# Patient Record
Sex: Female | Born: 2000 | Race: Black or African American | Hispanic: No | Marital: Single | Smoking: Never smoker
Health system: Southern US, Community
[De-identification: ages and names within clinical notes are randomized; demographics above are authoritative.]

---
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
## 2019-06-13 DIAGNOSIS — U071 COVID-19: Secondary | ICD-10-CM | POA: Insufficient documentation

## 2019-07-10 ENCOUNTER — Other Ambulatory Visit: Payer: Self-pay

## 2019-07-10 ENCOUNTER — Emergency Department
Admission: EM | Admit: 2019-07-10 | Discharge: 2019-07-10 | Disposition: A | Discharge status: Home / Self Care | Payer: No Typology Code available for payment source | Attending: Emergency Medicine | Admitting: Emergency Medicine

## 2019-07-10 DIAGNOSIS — K529 Noninfective gastroenteritis and colitis, unspecified: Secondary | ICD-10-CM | POA: Insufficient documentation

## 2019-07-10 DIAGNOSIS — R197 Diarrhea, unspecified: Secondary | ICD-10-CM | POA: Insufficient documentation

## 2019-07-10 LAB — COMPREHENSIVE METABOLIC PANEL
ALT: 20 U/L (ref 0–44)
AST: 13 U/L — ABNORMAL LOW (ref 15–41)
Albumin: 3.7 g/dL (ref 3.5–5.0)
Alkaline Phosphatase: 78 U/L (ref 38–126)
Anion gap: 8 (ref 5–15)
BUN: 13 mg/dL (ref 6–20)
CO2: 22 mmol/L (ref 22–32)
Calcium: 8.7 mg/dL — ABNORMAL LOW (ref 8.9–10.3)
Chloride: 107 mmol/L (ref 98–111)
Creatinine, Ser: 0.64 mg/dL (ref 0.44–1.00)
GFR calc Af Amer: >60 mL/min (ref >60)
GFR calc non Af Amer: >60 mL/min (ref >60)
Glucose, Bld: 98 mg/dL (ref 70–99)
Potassium: 3.4 mmol/L — ABNORMAL LOW (ref 3.5–5.1)
Sodium: 137 mmol/L (ref 135–145)
Total Bilirubin: 0.6 mg/dL (ref 0.3–1.2)
Total Protein: 7.9 g/dL (ref 6.5–8.1)

## 2019-07-10 LAB — LIPASE, BLOOD: Lipase: 21 U/L (ref 11–51)

## 2019-07-10 LAB — CBC
HCT: 34.6 % — ABNORMAL LOW (ref 36.0–46.0)
Hemoglobin: 10.6 g/dL — ABNORMAL LOW (ref 12.0–15.0)
MCH: 22.6 pg — ABNORMAL LOW (ref 26.0–34.0)
MCHC: 30.6 g/dL (ref 30.0–36.0)
MCV: 73.6 fL — ABNORMAL LOW (ref 80.0–100.0)
Platelets: 355 10*3/uL (ref 150–400)
RBC: 4.7 MIL/uL (ref 3.87–5.11)
RDW: 21.3 % — ABNORMAL HIGH (ref 11.5–15.5)
WBC: 6.2 10*3/uL (ref 4.0–10.5)
nRBC: 0 % (ref 0.0–0.2)

## 2019-07-10 MED ORDER — ONDANSETRON 4 MG PO TBDP
4.0000 mg | ORAL_TABLET | Freq: Once | ORAL | Status: AC
  Administered 2019-07-10: 4 mg via ORAL
  Filled 2019-07-10: qty 1

## 2019-07-10 MED ORDER — ONDANSETRON 4 MG PO TBDP: 4.0000 mg | ORAL_TABLET | Freq: Three times a day (TID) | ORAL | 0 refills | Status: AC | PRN

## 2019-07-10 NOTE — ED Provider Notes (Signed)
[facility] Emergency Department Provider Note   ____________________________________________    I have reviewed the triage vital signs and the nursing notes.   HISTORY  Chief Complaint Diarrhea, Nausea, and Headache     HPI Diamond Bishop is a 19 y.o. female who presents with complaints of nausea vomiting abdominal cramping and diarrhea, she notes the symptoms been ongoing for several days.  She denies known sick contacts.  No fevers or chills.  No loss of taste or smell.  Has not take anything for this.  Nonbilious nonbloody vomitus.  Watery stools.  No recent travel.  No recent camping  No past medical history on file.  There are no problems to display for this patient.   No past surgical history on file.  Prior to Admission medications   Medication Sig Start Date End Date Taking? Authorizing Provider  ondansetron (ZOFRAN ODT) 4 MG disintegrating tablet Take 1 tablet (4 mg total) by mouth every 8 (eight) hours as needed. 07/10/19   Lavonia Drafts, MD  prochlorperazine (COMPAZINE) 10 MG tablet Take 1 tablet (10 mg total) by mouth every 6 (six) hours as needed for nausea or vomiting. 08/03/17   Merlyn Lot, MD     Allergies Patient has no known allergies.  No family history on file.  Social History Social History   Tobacco Use  . Smoking status: Never Smoker  . Smokeless tobacco: Never Used  Substance Use Topics  . Alcohol use: Not on file  . Drug use: Not on file    Review of Systems  Constitutional: No fever/chills Eyes: No visual changes.  ENT: No sore throat. Cardiovascular: Denies chest pain. Respiratory: Denies cough Gastrointestinal: As above Genitourinary: Negative for dysuria. Musculoskeletal: Negative for joint swelling Skin: Negative for rash. Neurological: Occasional headache   ____________________________________________   PHYSICAL EXAM:  VITAL SIGNS: ED Triage Vitals  Enc Vitals Group     BP  07/10/19 1003 128/78     Pulse Rate 07/10/19 1003 (!) 101     Resp 07/10/19 1003 18     Temp 07/10/19 1003 98.9 F (37.2 C)     Temp Source 07/10/19 1003 Oral     SpO2 07/10/19 1003 100 %     Weight 07/10/19 1003 127 kg (280 lb)     Height 07/10/19 1003 1.753 m (5\' 9" )     Head Circumference --      Peak Flow --      Pain Score 07/10/19 1017 5     Pain Loc --      Pain Edu? --      Excl. in [place]? --     Constitutional: Alert and oriented. No acute distress. Pleasant and interactive  Nose: No congestion/rhinnorhea. Mouth/Throat: Mucous membranes are moist.    Cardiovascular: Normal rate, regular rhythm. Kermit Balo peripheral circulation. Respiratory: Normal respiratory effort.  No retractions. Lungs CTAB. Gastrointestinal: Soft and nontender. No distention.  No CVA tenderness.  Reassuring exam  Musculoskeletal: .  Warm and well perfused Neurologic:  Normal speech and language. No gross focal neurologic deficits are appreciated.  Skin:  Skin is warm, dry and intact. No rash noted. Psychiatric: Mood and affect are normal. Speech and behavior are normal.  ____________________________________________   LABS (all labs ordered are listed, but only abnormal results are displayed)  Labs Reviewed  COMPREHENSIVE METABOLIC PANEL - Abnormal; Notable for the following components:      Result Value   Potassium 3.4 (*)  Calcium 8.7 (*)    AST 13 (*)    All other components within normal limits  CBC - Abnormal; Notable for the following components:   Hemoglobin 10.6 (*)    HCT 34.6 (*)    MCV 73.6 (*)    MCH 22.6 (*)    RDW 21.3 (*)    All other components within normal limits  LIPASE, BLOOD  URINALYSIS, COMPLETE (UACMP) WITH MICROSCOPIC  POC URINE PREG, ED   ____________________________________________  EKG   ____________________________________________  RADIOLOGY   ____________________________________________   PROCEDURES  Procedure(s) performed:  No  Procedures   Critical Care performed: No ____________________________________________   INITIAL IMPRESSION / ASSESSMENT AND PLAN / ED COURSE  Pertinent labs & imaging results that were available during my care of the patient were reviewed by me and considered in my medical decision making (see chart for details).  Patient presents with nausea vomiting abdominal cramping and diarrhea suspicious for viral gastroenteritis which is prevalent in the community at this time.  Abdominal exam is quite reassuring, no significant tenderness, no distention.  Patient appears well-hydrated with moist mucous membranes.  Lab work is overall unremarkable, white blood cell count is normal.  BUN/creatinine ratio is normal.  LFTs are unremarkable.  We will treat with ODT Zofran, prescription provided for the same.  Recommend supportive care, outpatient follow-up, return precautions discussed    ____________________________________________   FINAL CLINICAL IMPRESSION(S) / ED DIAGNOSES  Final diagnoses:  Gastroenteritis        Note:  This document was prepared using Dragon voice recognition software and may include unintentional dictation errors.   Jene Every, MD 07/10/19 1534

## 2019-07-10 NOTE — ED Triage Notes (Signed)
Pt arrived via POV with reports of HA, Nausea and Diarrhea since Thursday, reports multiple episodes of diarrhea per day since Thursday.

## 2019-07-10 NOTE — ED Notes (Signed)
EDP at bedside at this time.  

## 2019-07-10 NOTE — ED Notes (Signed)
NAD noted at time of D/C. Pt denies questions or concerns. Pt ambulatory to the lobby at this time.  

## 2019-09-19 IMAGING — US US ABDOMEN LIMITED
1 series · 12 of 12 positions shown · non-contrast
Comparison: None.

CLINICAL DATA: Mononucleosis.  Left upper quadrant abdominal pain.

EXAM:
ULTRASOUND ABDOMEN LIMITED

[Series 1: us abdomen limited · 0.26mm/px · 12 of 12 slices shown]
[im 1/12]
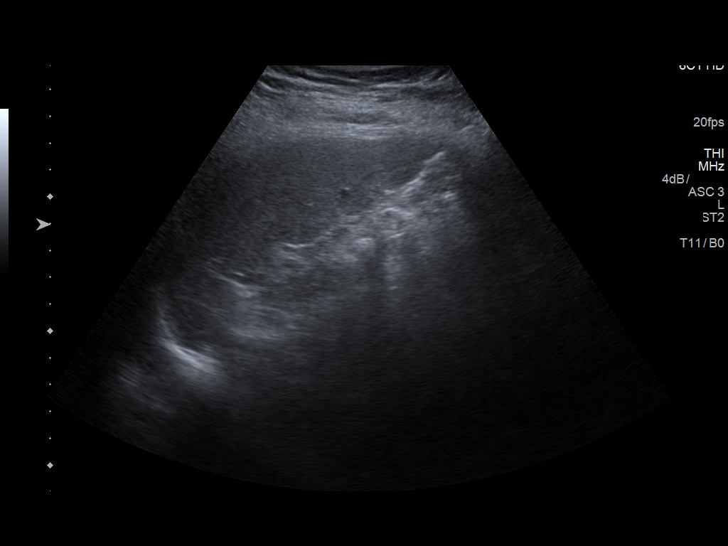
[im 2/12]
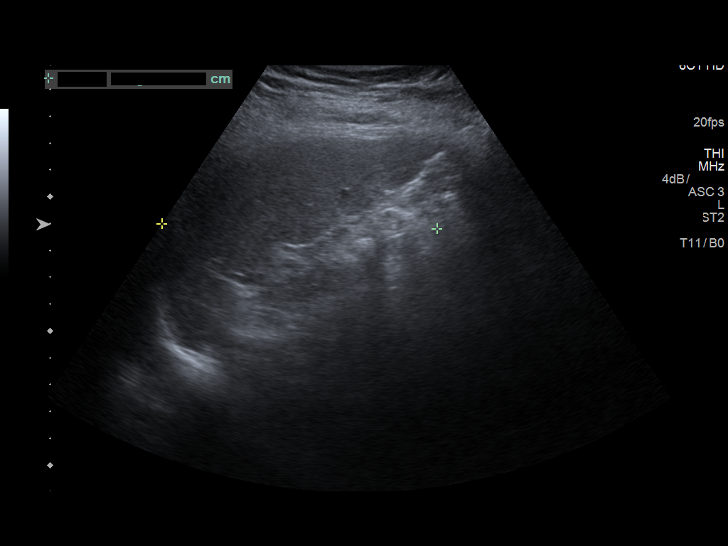
[im 3/12]
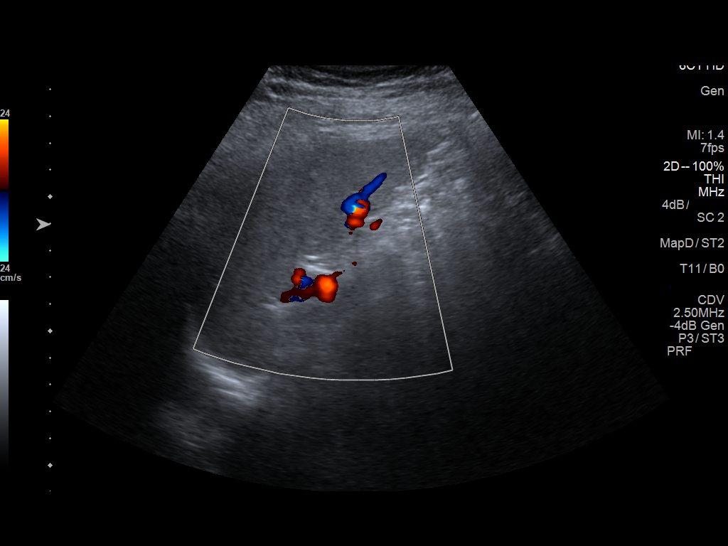
[im 4/12]
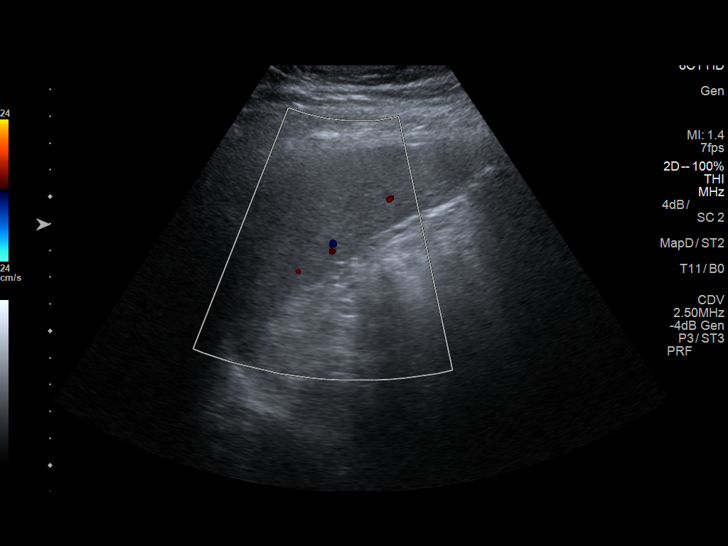
[im 5/12]
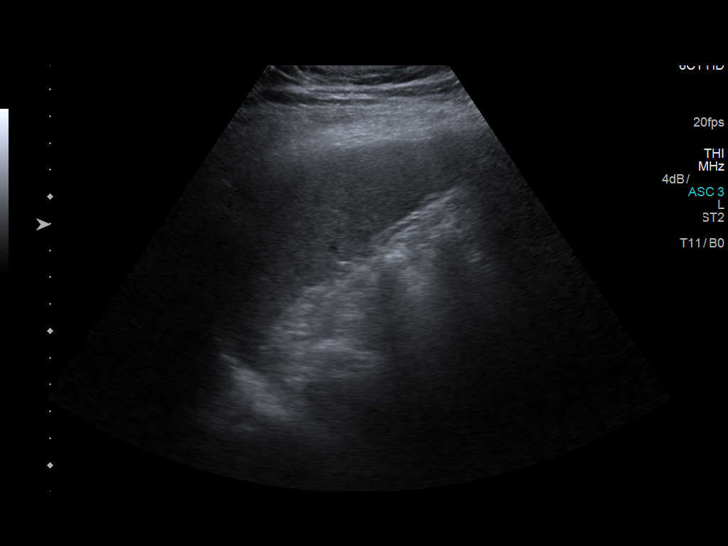
[im 6/12]
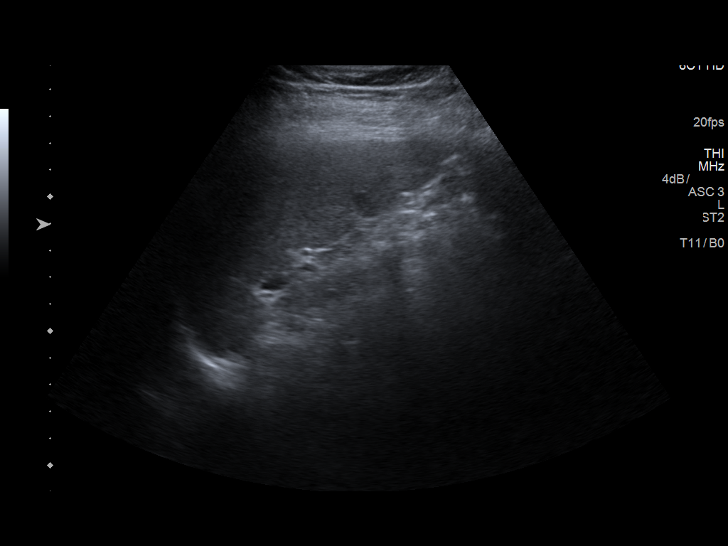
[im 7/12]
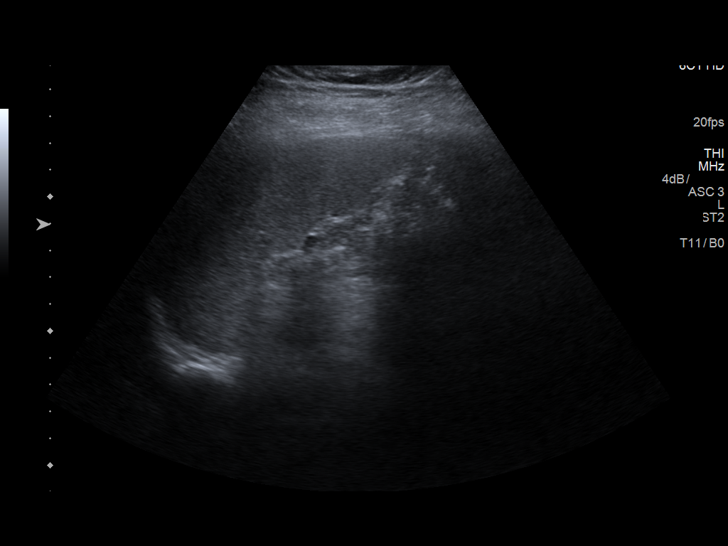
[im 8/12]
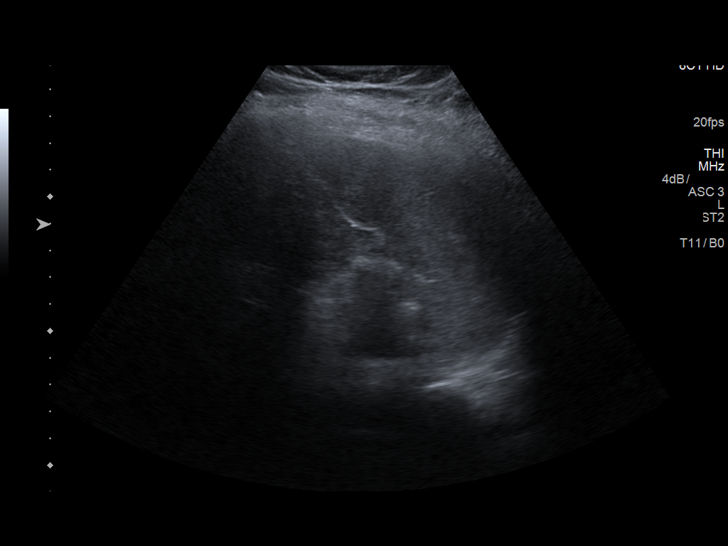
[im 9/12]
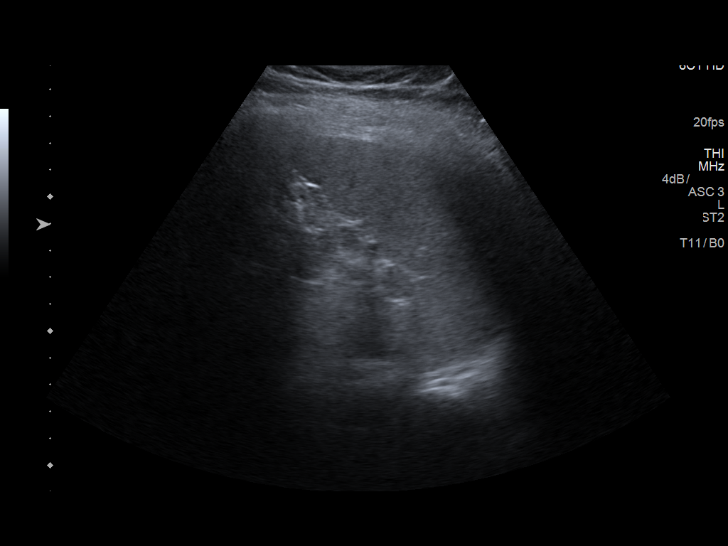
[im 10/12]
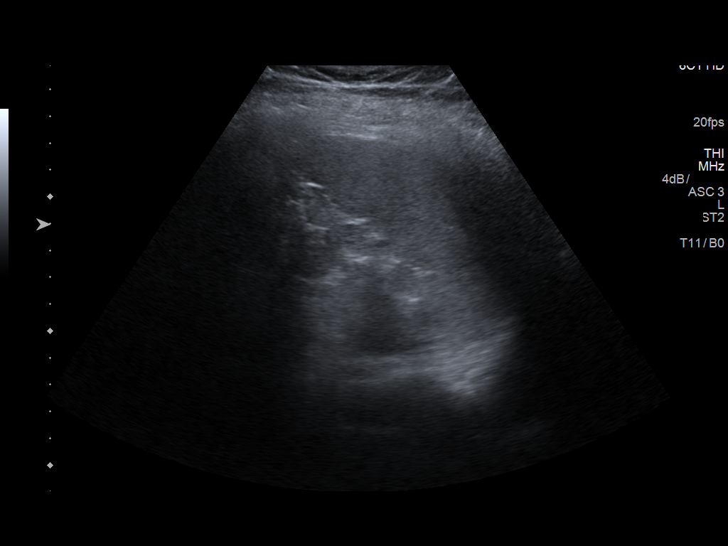
[im 11/12]
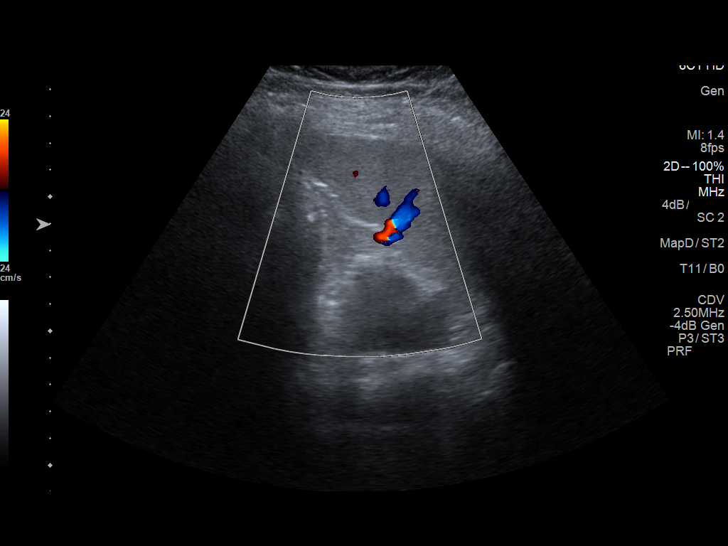
[im 12/12]
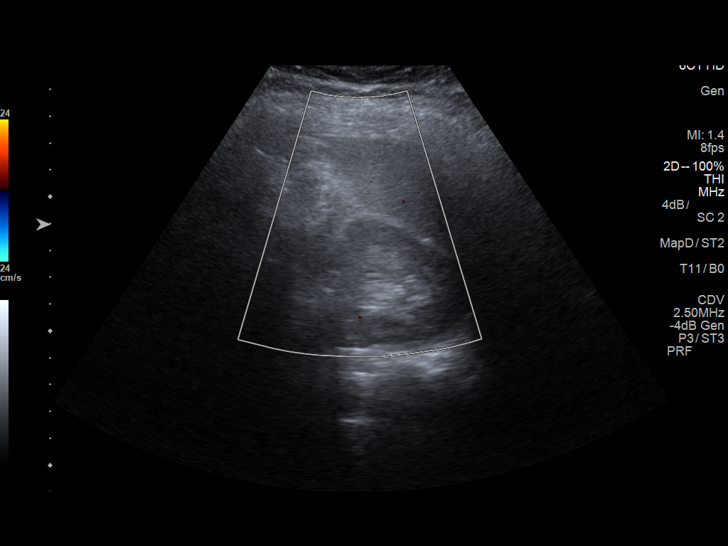

[12 of 12 positions shown; findings below may reference images not displayed]

FINDINGS: Craniocaudal splenic length 10.3 cm, normal. No splenic mass. No
perisplenic fluid collection.
IMPRESSION: Normal size spleen.  No splenic mass or perisplenic collection.

## 2022-04-11 ENCOUNTER — Emergency Department: Payer: Medicaid Other

## 2022-04-11 ENCOUNTER — Other Ambulatory Visit: Payer: Self-pay

## 2022-04-11 ENCOUNTER — Emergency Department
Admission: EM | Admit: 2022-04-11 | Discharge: 2022-04-11 | Discharge status: Against Medical Advice | Payer: Medicaid Other | Attending: Emergency Medicine | Admitting: Emergency Medicine

## 2022-04-11 DIAGNOSIS — R42 Dizziness and giddiness: Secondary | ICD-10-CM | POA: Insufficient documentation

## 2022-04-11 DIAGNOSIS — R519 Headache, unspecified: Secondary | ICD-10-CM | POA: Insufficient documentation

## 2022-04-11 DIAGNOSIS — R0602 Shortness of breath: Secondary | ICD-10-CM | POA: Insufficient documentation

## 2022-04-11 DIAGNOSIS — Z5321 Procedure and treatment not carried out due to patient leaving prior to being seen by health care provider: Secondary | ICD-10-CM | POA: Insufficient documentation

## 2022-04-11 DIAGNOSIS — R079 Chest pain, unspecified: Secondary | ICD-10-CM | POA: Insufficient documentation

## 2022-04-11 LAB — BASIC METABOLIC PANEL
Anion gap: 11 (ref 5–15)
BUN: 15 mg/dL (ref 6–20)
CO2: 22 mmol/L (ref 22–32)
Calcium: 8.5 mg/dL — ABNORMAL LOW (ref 8.9–10.3)
Chloride: 105 mmol/L (ref 98–111)
Creatinine, Ser: 0.74 mg/dL (ref 0.44–1.00)
GFR, Estimated: >60 mL/min (ref >60)
Glucose, Bld: 115 mg/dL — ABNORMAL HIGH (ref 70–99)
Potassium: 3.5 mmol/L (ref 3.5–5.1)
Sodium: 138 mmol/L (ref 135–145)

## 2022-04-11 LAB — CBC
HCT: 34.6 % — ABNORMAL LOW (ref 36.0–46.0)
Hemoglobin: 11.1 g/dL — ABNORMAL LOW (ref 12.0–15.0)
MCH: 26.4 pg (ref 26.0–34.0)
MCHC: 32.1 g/dL (ref 30.0–36.0)
MCV: 82.2 fL (ref 80.0–100.0)
Platelets: 315 10*3/uL (ref 150–400)
RBC: 4.21 MIL/uL (ref 3.87–5.11)
RDW: 17.5 % — ABNORMAL HIGH (ref 11.5–15.5)
WBC: 8.7 10*3/uL (ref 4.0–10.5)
nRBC: 0 % (ref 0.0–0.2)

## 2022-04-11 LAB — TROPONIN I (HIGH SENSITIVITY): Troponin I (High Sensitivity): <2 ng/L (ref <18)

## 2022-04-11 NOTE — ED Triage Notes (Signed)
Pt presents to ED via [facility] with c/o Chest pain that started an hr ago. Pt states she got a headache and was lightheaded before having the chest pain. Pt was SOB earlier as well but that went away as well. Pt reports pain feels like cramping in middle of chest and radiated to left breast.

## 2022-04-11 NOTE — ED Notes (Signed)
No answer when called several times from lobby 

## 2022-04-11 NOTE — ED Triage Notes (Signed)
EMS brings pt in from home for c/o CP

## 2022-11-20 ENCOUNTER — Ambulatory Visit: Payer: Medicaid Other | Admitting: Family

## 2022-11-24 ENCOUNTER — Ambulatory Visit: Payer: Medicaid Other | Admitting: Family

## 2023-03-23 ENCOUNTER — Ambulatory Visit: Payer: Medicaid Other | Admitting: Family

## 2023-07-08 ENCOUNTER — Encounter: Payer: Self-pay | Admitting: Family

## 2023-07-08 ENCOUNTER — Ambulatory Visit: Payer: Self-pay | Admitting: Family

## 2023-07-08 VITALS — BP 100/74 | HR 74 | Ht 70.0 in | Wt 269.0 lb

## 2023-07-08 DIAGNOSIS — N6321 Unspecified lump in the left breast, upper outer quadrant: Secondary | ICD-10-CM

## 2023-07-08 DIAGNOSIS — Z0130 Encounter for examination of blood pressure without abnormal findings: Secondary | ICD-10-CM

## 2023-07-08 NOTE — Progress Notes (Signed)
   Acute Office Visit  Subjective:     Patient ID: Diamond Bishop, female    DOB: [birth year], 23 y.o.   MRN: [id number]  Patient is in today for  Chief Complaint  Patient presents with   Acute Visit    Bump on left breast    Patient is here for a left sided breast lump.  She says that initially it was red, but that over time it has changed color and gotten much darker.  She also says that it is harder now than it was at first.   Her grandmother was diagnosed with breast cancer a few months ago, so she and her mother wanted her to have it evaluated.        Review of Systems  Skin:        Breast lump, left side  All other systems reviewed and are negative.       Objective:    BP 100/74   Pulse 74   Ht 5\' 10"  (1.778 m)   Wt 269 lb (122 kg)   SpO2 99%   BMI 38.60 kg/m   Physical Exam Vitals and nursing note reviewed.  Constitutional:      Appearance: Normal appearance. She is normal weight.  HENT:     Head: Normocephalic.  Eyes:     Extraocular Movements: Extraocular movements intact.     Conjunctiva/sclera: Conjunctivae normal.     Pupils: Pupils are equal, round, and reactive to light.  Cardiovascular:     Rate and Rhythm: Normal rate.  Pulmonary:     Effort: Pulmonary effort is normal.  Chest:     Chest wall: Mass present.    Neurological:     General: No focal deficit present.     Mental Status: She is alert and oriented to person, place, and time. Mental status is at baseline.  Psychiatric:        Mood and Affect: Mood normal.        Behavior: Behavior normal.        Thought Content: Thought content normal.     No results found for any visits on 07/08/23.  No results found for this or any previous visit (from the past 2160 hours).  Allergies as of 07/08/2023   No Known Allergies      Medication List        Accurate as of July 08, 2023  4:28 PM. If you have any questions, ask your nurse or doctor.          ondansetron  4 MG  disintegrating tablet Commonly known as: Zofran  ODT Take 1 tablet (4 mg total) by mouth every 8 (eight) hours as needed.   prochlorperazine  10 MG tablet Commonly known as: COMPAZINE  Take 1 tablet (10 mg total) by mouth every 6 (six) hours as needed for nausea or vomiting.            Assessment & Plan:   Problem List Items Addressed This Visit   None    Return to be determined baesd on results.  Total time spent: 20 minutes  Trenda Frisk, FNP  07/08/2023   This document may have been prepared by [facility] Voice Recognition software and as such may include unintentional dictation errors.

## 2023-07-14 ENCOUNTER — Telehealth: Payer: Self-pay | Admitting: Family

## 2023-07-14 NOTE — Telephone Encounter (Signed)
 Patient left VM that she wants an Rx for [facility] or Ozempic for her weight. Please advise.

## 2023-07-17 MED ORDER — WEGOVY 0.25 MG/0.5ML ~~LOC~~ SOAJ: 0.2500 mg | SUBCUTANEOUS | 0 refills | Status: AC

## 2023-07-28 ENCOUNTER — Ambulatory Visit
Admission: RE | Admit: 2023-07-28 | Discharge: 2023-07-28 | Disposition: A | Discharge status: Home / Self Care | Source: Ambulatory Visit | Attending: Family | Admitting: Family

## 2023-07-28 DIAGNOSIS — N6321 Unspecified lump in the left breast, upper outer quadrant: Secondary | ICD-10-CM | POA: Insufficient documentation

## 2023-11-14 DIAGNOSIS — R2243 Localized swelling, mass and lump, lower limb, bilateral: Secondary | ICD-10-CM | POA: Diagnosis not present

## 2023-11-14 DIAGNOSIS — M7989 Other specified soft tissue disorders: Secondary | ICD-10-CM | POA: Diagnosis not present

## 2023-11-14 DIAGNOSIS — R600 Localized edema: Secondary | ICD-10-CM | POA: Diagnosis not present

## 2024-02-29 ENCOUNTER — Ambulatory Visit: Payer: Self-pay | Admitting: Internal Medicine
